# Patient Record
Sex: Male | Born: 1985 | Race: White | Hispanic: No | Marital: Single | Smoking: Current every day smoker
Health system: Southern US, Community
[De-identification: ages and names within clinical notes are randomized; demographics above are authoritative.]

## PROBLEM LIST (undated history)

## (undated) DIAGNOSIS — I10 Essential (primary) hypertension: Secondary | ICD-10-CM

---

## 2018-03-10 ENCOUNTER — Other Ambulatory Visit: Payer: Self-pay

## 2018-03-10 ENCOUNTER — Emergency Department (HOSPITAL_COMMUNITY)
Admission: EM | Admit: 2018-03-10 | Discharge: 2018-03-10 | Disposition: A | Discharge status: Home / Self Care | Payer: Self-pay | Attending: Emergency Medicine | Admitting: Emergency Medicine

## 2018-03-10 ENCOUNTER — Emergency Department (HOSPITAL_COMMUNITY): Payer: Self-pay

## 2018-03-10 ENCOUNTER — Encounter (HOSPITAL_COMMUNITY): Payer: Self-pay

## 2018-03-10 DIAGNOSIS — I10 Essential (primary) hypertension: Secondary | ICD-10-CM | POA: Insufficient documentation

## 2018-03-10 DIAGNOSIS — Y9389 Activity, other specified: Secondary | ICD-10-CM | POA: Insufficient documentation

## 2018-03-10 DIAGNOSIS — F17210 Nicotine dependence, cigarettes, uncomplicated: Secondary | ICD-10-CM | POA: Insufficient documentation

## 2018-03-10 DIAGNOSIS — Y999 Unspecified external cause status: Secondary | ICD-10-CM | POA: Insufficient documentation

## 2018-03-10 DIAGNOSIS — S71132A Puncture wound without foreign body, left thigh, initial encounter: Secondary | ICD-10-CM | POA: Insufficient documentation

## 2018-03-10 DIAGNOSIS — Y92818 Other transport vehicle as the place of occurrence of the external cause: Secondary | ICD-10-CM | POA: Insufficient documentation

## 2018-03-10 DIAGNOSIS — W3400XA Accidental discharge from unspecified firearms or gun, initial encounter: Secondary | ICD-10-CM | POA: Insufficient documentation

## 2018-03-10 DIAGNOSIS — S3991XA Unspecified injury of abdomen, initial encounter: Secondary | ICD-10-CM | POA: Insufficient documentation

## 2018-03-10 HISTORY — DX: Essential (primary) hypertension: I10

## 2018-03-10 LAB — CBC
HCT: 44.3 % (ref 39.0–52.0)
Hemoglobin: 14.4 g/dL (ref 13.0–17.0)
MCH: 28.2 pg (ref 26.0–34.0)
MCHC: 32.5 g/dL (ref 30.0–36.0)
MCV: 86.9 fL (ref 80.0–100.0)
Platelets: 246 10*3/uL (ref 150–400)
RBC: 5.1 MIL/uL (ref 4.22–5.81)
RDW: 12.3 % (ref 11.5–15.5)
WBC: 7.9 10*3/uL (ref 4.0–10.5)
nRBC: 0 % (ref 0.0–0.2)

## 2018-03-10 LAB — SAMPLE TO BLOOD BANK

## 2018-03-10 LAB — URINALYSIS, ROUTINE W REFLEX MICROSCOPIC
Bilirubin Urine: NEGATIVE
Glucose, UA: NEGATIVE mg/dL
Hgb urine dipstick: NEGATIVE
Ketones, ur: NEGATIVE mg/dL
Leukocytes, UA: NEGATIVE
Nitrite: NEGATIVE
PH: 6.5 (ref 5.0–8.0)
Protein, ur: NEGATIVE mg/dL
Specific Gravity, Urine: 1.015 (ref 1.005–1.030)

## 2018-03-10 LAB — COMPREHENSIVE METABOLIC PANEL
ALT: 62 U/L — AB (ref 0–44)
AST: 43 U/L — ABNORMAL HIGH (ref 15–41)
Albumin: 4.4 g/dL (ref 3.5–5.0)
Alkaline Phosphatase: 57 U/L (ref 38–126)
Anion gap: 13 (ref 5–15)
BUN: 15 mg/dL (ref 6–20)
CO2: 24 mmol/L (ref 22–32)
CREATININE: 0.99 mg/dL (ref 0.61–1.24)
Calcium: 9.3 mg/dL (ref 8.9–10.3)
Chloride: 102 mmol/L (ref 98–111)
GFR calc non Af Amer: >60 mL/min (ref >60)
Glucose, Bld: 88 mg/dL (ref 70–99)
Potassium: 4.4 mmol/L (ref 3.5–5.1)
Sodium: 139 mmol/L (ref 135–145)
Total Bilirubin: 1.2 mg/dL (ref 0.3–1.2)
Total Protein: 7.7 g/dL (ref 6.5–8.1)

## 2018-03-10 LAB — I-STAT TROPONIN, ED: Troponin i, poc: 0 ng/mL (ref 0.00–0.08)

## 2018-03-10 LAB — I-STAT CG4 LACTIC ACID, ED: Lactic Acid, Venous: 2.17 mmol/L (ref 0.5–1.9)

## 2018-03-10 LAB — PROTIME-INR
INR: 0.95
Prothrombin Time: 12.6 seconds (ref 11.4–15.2)

## 2018-03-10 LAB — CDS SEROLOGY

## 2018-03-10 LAB — ETHANOL: Alcohol, Ethyl (B): 37 mg/dL — ABNORMAL HIGH (ref <10)

## 2018-03-10 MED ORDER — SODIUM CHLORIDE 0.9 % IV SOLN
Freq: Once | INTRAVENOUS | Status: AC
  Administered 2018-03-10: 09:00:00 via INTRAVENOUS

## 2018-03-10 MED ORDER — CEPHALEXIN 500 MG PO CAPS: 1000.0000 mg | ORAL_CAPSULE | Freq: Two times a day (BID) | ORAL | 0 refills | Status: DC

## 2018-03-10 MED ORDER — IBUPROFEN 800 MG PO TABS: 800.0000 mg | ORAL_TABLET | Freq: Three times a day (TID) | ORAL | 0 refills | Status: AC

## 2018-03-10 MED ORDER — CEFAZOLIN SODIUM-DEXTROSE 1-4 GM/50ML-% IV SOLN
1.0000 g | Freq: Once | INTRAVENOUS | Status: AC
  Administered 2018-03-10: 1 g via INTRAVENOUS
  Filled 2018-03-10: qty 50

## 2018-03-10 MED ORDER — MORPHINE SULFATE (PF) 4 MG/ML IV SOLN
4.0000 mg | Freq: Once | INTRAVENOUS | Status: AC
  Administered 2018-03-10: 4 mg via INTRAVENOUS
  Filled 2018-03-10: qty 1

## 2018-03-10 MED ORDER — IOPAMIDOL (ISOVUE-370) INJECTION 76%
100.0000 mL | Freq: Once | INTRAVENOUS | Status: AC | PRN
  Administered 2018-03-10: 100 mL via INTRAVENOUS
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
## 2018-03-10 NOTE — ED Notes (Signed)
Pt taken to CT scan.

## 2018-03-10 NOTE — ED Notes (Signed)
Pt states that he did 2 lines of cocaine early this morning, took a xanax, and drank alcohol. Pt is on suboxone for previous opana use. Also states that he has been having chest pain x 2 days.
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
## 2020-09-10 ENCOUNTER — Encounter (HOSPITAL_COMMUNITY): Payer: Self-pay

## 2020-09-10 ENCOUNTER — Emergency Department (HOSPITAL_COMMUNITY)
Admission: EM | Admit: 2020-09-10 | Discharge: 2020-09-11 | Disposition: A | Discharge status: Home / Self Care | Payer: Managed Care, Other (non HMO) | Attending: Emergency Medicine | Admitting: Emergency Medicine

## 2020-09-10 ENCOUNTER — Other Ambulatory Visit: Payer: Self-pay

## 2020-09-10 DIAGNOSIS — X58XXXA Exposure to other specified factors, initial encounter: Secondary | ICD-10-CM | POA: Diagnosis not present

## 2020-09-10 DIAGNOSIS — S60521A Blister (nonthermal) of right hand, initial encounter: Secondary | ICD-10-CM | POA: Diagnosis present

## 2020-09-10 DIAGNOSIS — Z5321 Procedure and treatment not carried out due to patient leaving prior to being seen by health care provider: Secondary | ICD-10-CM | POA: Diagnosis not present

## 2020-09-10 DIAGNOSIS — K130 Diseases of lips: Secondary | ICD-10-CM | POA: Diagnosis not present

## 2020-09-10 DIAGNOSIS — S60522A Blister (nonthermal) of left hand, initial encounter: Secondary | ICD-10-CM | POA: Diagnosis not present
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
## 2020-09-10 NOTE — ED Triage Notes (Addendum)
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 Pt c/o blisters on hands, c/o lips bleeding. There are no open wounds to hands. Pts lips are not currently bleeding, there are no open wounds to lips. Pt states he has not taken his Seroquel in a day and a half. Pt states he "just doesn't feel good at all." Pt denies SI and HI.

## 2020-11-30 DIAGNOSIS — R778 Other specified abnormalities of plasma proteins: Secondary | ICD-10-CM

## 2021-12-15 ENCOUNTER — Emergency Department (HOSPITAL_COMMUNITY): Payer: Commercial Managed Care - PPO

## 2021-12-15 ENCOUNTER — Emergency Department (HOSPITAL_COMMUNITY)
Admission: EM | Admit: 2021-12-15 | Discharge: 2021-12-15 | Disposition: A | Discharge status: Home / Self Care | Payer: Commercial Managed Care - PPO | Attending: Emergency Medicine | Admitting: Emergency Medicine

## 2021-12-15 ENCOUNTER — Encounter (HOSPITAL_COMMUNITY): Payer: Self-pay | Admitting: Emergency Medicine

## 2021-12-15 DIAGNOSIS — K047 Periapical abscess without sinus: Secondary | ICD-10-CM | POA: Insufficient documentation

## 2021-12-15 DIAGNOSIS — R220 Localized swelling, mass and lump, head: Secondary | ICD-10-CM | POA: Diagnosis present

## 2021-12-15 DIAGNOSIS — L03211 Cellulitis of face: Secondary | ICD-10-CM

## 2021-12-15 LAB — CBC WITH DIFFERENTIAL/PLATELET
Abs Immature Granulocytes: 0.04 10*3/uL (ref 0.00–0.07)
Basophils Absolute: 0 10*3/uL (ref 0.0–0.1)
Basophils Relative: 0 %
Eosinophils Absolute: 0 10*3/uL (ref 0.0–0.5)
Eosinophils Relative: 1 %
HCT: 41.6 % (ref 39.0–52.0)
Hemoglobin: 13.2 g/dL (ref 13.0–17.0)
Immature Granulocytes: 1 %
Lymphocytes Relative: 16 %
Lymphs Abs: 1.4 10*3/uL (ref 0.7–4.0)
MCH: 27 pg (ref 26.0–34.0)
MCHC: 31.7 g/dL (ref 30.0–36.0)
MCV: 85.2 fL (ref 80.0–100.0)
Monocytes Absolute: 1 10*3/uL (ref 0.1–1.0)
Monocytes Relative: 12 %
Neutro Abs: 6.1 10*3/uL (ref 1.7–7.7)
Neutrophils Relative %: 70 %
Platelets: 305 10*3/uL (ref 150–400)
RBC: 4.88 MIL/uL (ref 4.22–5.81)
RDW: 12.7 % (ref 11.5–15.5)
WBC: 8.7 10*3/uL (ref 4.0–10.5)
nRBC: 0 % (ref 0.0–0.2)

## 2021-12-15 LAB — BASIC METABOLIC PANEL
Anion gap: 11 (ref 5–15)
BUN: 14 mg/dL (ref 6–20)
CO2: 25 mmol/L (ref 22–32)
Calcium: 9.6 mg/dL (ref 8.9–10.3)
Chloride: 103 mmol/L (ref 98–111)
Creatinine, Ser: 0.98 mg/dL (ref 0.61–1.24)
GFR, Estimated: >60 mL/min (ref >60)
Glucose, Bld: 100 mg/dL — ABNORMAL HIGH (ref 70–99)
Potassium: 4.2 mmol/L (ref 3.5–5.1)
Sodium: 139 mmol/L (ref 135–145)

## 2021-12-15 LAB — I-STAT CHEM 8, ED
BUN: 15 mg/dL (ref 6–20)
Calcium, Ion: 1.17 mmol/L (ref 1.15–1.40)
Chloride: 105 mmol/L (ref 98–111)
Creatinine, Ser: 0.9 mg/dL (ref 0.61–1.24)
Glucose, Bld: 97 mg/dL (ref 70–99)
HCT: 42 % (ref 39.0–52.0)
Hemoglobin: 14.3 g/dL (ref 13.0–17.0)
Potassium: 4.1 mmol/L (ref 3.5–5.1)
Sodium: 139 mmol/L (ref 135–145)
TCO2: 24 mmol/L (ref 22–32)

## 2021-12-15 MED ORDER — AMOXICILLIN-POT CLAVULANATE 875-125 MG PO TABS
1.0000 | ORAL_TABLET | Freq: Once | ORAL | Status: AC
  Administered 2021-12-15: 1 via ORAL
  Filled 2021-12-15: qty 1

## 2021-12-15 MED ORDER — AMOXICILLIN-POT CLAVULANATE 875-125 MG PO TABS: 1.0000 | ORAL_TABLET | Freq: Two times a day (BID) | ORAL | 0 refills | Status: AC

## 2021-12-15 MED ORDER — IOHEXOL 350 MG/ML SOLN
100.0000 mL | Freq: Once | INTRAVENOUS | Status: AC | PRN
  Administered 2021-12-15: 75 mL via INTRAVENOUS

## 2021-12-15 NOTE — ED Provider Notes (Signed)
[facility] EMERGENCY DEPARTMENT Provider Note   CSN: [id number] Arrival date & time: 12/15/21  1202     History  Chief Complaint  Patient presents with   Facial Swelling    Ronald Patel is a 36 y.o. male with history of hypertension who presents the emergency department complaining of facial swelling.  Patient states that he has always had a "bad tooth" in his left lower mouth for "a while".  States that the left side of his neck and his face started swelling about 3 days ago.  No fever.  Having some difficulty opening his mouth, but no difficulty swallowing or breathing.  Has not seen a dentist in some time.  HPI     Home Medications Prior to Admission medications   Medication Sig Start Date End Date Taking? Authorizing Provider  amoxicillin-clavulanate (AUGMENTIN) 875-125 MG tablet Take 1 tablet by mouth 2 (two) times daily for 14 days. 12/15/21 12/29/21 Yes Raymond Azure T, PA-C  ibuprofen (ADVIL,MOTRIN) 800 MG tablet Take 1 tablet (800 mg total) by mouth 3 (three) times daily. 03/10/18   Charlesetta Shanks, MD      Allergies    Patient has no known allergies.    Review of Systems   Review of Systems  Constitutional:  Negative for chills and fever.  HENT:  Positive for dental problem and facial swelling. Negative for sore throat, trouble swallowing and voice change.   Respiratory:  Negative for shortness of breath.   All other systems reviewed and are negative.   Physical Exam Updated Vital Signs BP (!) 165/90 (BP Location: Right Arm)   Pulse 100   Temp 98.5 F (36.9 C) (Oral)   Resp 16   SpO2 99%  Physical Exam Vitals and nursing note reviewed.  Constitutional:      Appearance: Normal appearance.  HENT:     Head: Normocephalic and atraumatic.     Mouth/Throat:     Comments: Mild trismus.  Left submandibular swelling and tenderness, without crepitus.  No sublingual swelling.  Tolerating own secretions. Eyes:     Conjunctiva/sclera:  Conjunctivae normal.  Neck:     Trachea: Phonation normal.     Comments: Left-sided neck swelling, without lymphadenopathy Pulmonary:     Effort: Pulmonary effort is normal. No respiratory distress.     Breath sounds: No stridor.  Skin:    General: Skin is warm and dry.  Neurological:     Mental Status: He is alert.  Psychiatric:        Mood and Affect: Mood normal.        Behavior: Behavior normal.     ED Results / Procedures / Treatments   Labs (all labs ordered are listed, but only abnormal results are displayed) Labs Reviewed  BASIC METABOLIC PANEL - Abnormal; Notable for the following components:      Result Value   Glucose, Bld 100 (*)    All other components within normal limits  CBC WITH DIFFERENTIAL/PLATELET  I-STAT CHEM 8, ED    EKG None  Radiology CT Soft Tissue Neck W Contrast  Result Date: 12/15/2021 CLINICAL DATA:  Soft tissue swelling left face. Infection suspected. EXAM: CT NECK WITH CONTRAST TECHNIQUE: Multidetector CT imaging of the neck was performed using the standard protocol following the bolus administration of intravenous contrast. RADIATION DOSE REDUCTION: This exam was performed according to the departmental dose-optimization program which includes automated exposure control, adjustment of the mA and/or kV according to patient size and/or use of iterative reconstruction  technique. CONTRAST:  33mL OMNIPAQUE IOHEXOL 350 MG/ML SOLN COMPARISON:  None Available. FINDINGS: Pharynx and larynx: Normal. No mass or swelling. Salivary glands: No inflammation, mass, or stone. Thyroid: Negative Lymph nodes: No pathologic adenopathy in the neck. Right level 2 lymph node 8.3 mm. Left level 2 lymph node 10 mm. Vascular: Normal vascular enhancement. Limited intracranial: Negative Visualized orbits: Negative Mastoids and visualized paranasal sinuses: Mild mucosal edema left maxillary sinus. Remaining sinuses clear. Skeleton: Negative for fracture Poor dentition with  multiple caries. Periapical lucency around left upper and lower molars. Upper chest: Lung apices clear bilaterally Other: Soft tissue swelling lateral to the maxilla and mandible on the left. This is likely cellulitis due to adjacent dental disease. No soft tissue abscess Metal BB in the soft tissues anterior to the mandible on the right. IMPRESSION: Soft tissue swelling lateral to the maxilla mandible on the left. This is most likely cellulitis related to dental infection. No soft tissue abscess. Electronically Signed   By: Franchot Gallo M.D.   On: 12/15/2021 15:20    Procedures Procedures    Medications Ordered in ED Medications  amoxicillin-clavulanate (AUGMENTIN) 875-125 MG per tablet 1 tablet (has no administration in time range)  iohexol (OMNIPAQUE) 350 MG/ML injection 100 mL (75 mLs Intravenous Contrast Given 12/15/21 1506)    ED Course/ Medical Decision Making/ A&P                           Medical Decision Making Risk Prescription drug management.  This patient is a 36 y.o. male  who presents to the ED for concern of left sided facial swelling and dental pain.   Differential diagnoses prior to evaluation: The emergent differential diagnosis includes, but is not limited to,  dental abscess, cellulitis, PTA, ludwig's angina. This is not an exhaustive differential.   Past Medical History / Co-morbidities: HTN  Physical Exam: Physical exam performed. The pertinent findings include: Afebrile, not tachycardic. Mild trismus. Left sided facial and neck swelling, with tenderness over submandibular area. No lymphadenopathy. No sublingual swelling. Normal phonation. Tolerating PO and no stridor.   Lab Tests/Imaging studies: I personally interpreted labs/imaging and the pertinent results include: CBC and BMP unremarkable.  CT soft tissue neck with soft tissue swelling lateral to the mandible on the left, no soft tissue abscess.  Likely cellulitis due to adjacent dental disease.  I agree  with the radiologist interpretation.  Medications: I ordered medication including antibiotics.  I have reviewed the patients home medicines and have made adjustments as needed.   Disposition: After consideration of the diagnostic results and the patients response to treatment, I feel that emergency department workup does not suggest an emergent condition requiring admission or immediate intervention beyond what has been performed at this time. The plan is: discharge to home with antibiotics and dental follow up. Low suspicion for Ludwig's angina at this time, due to normal temperature, normal laboratory workup, no sublingual swelling, and no difficulty swallowing or breathing. Patient is at high risk for developing worsening infection and we had thorough discussion about return precautions. The patient is safe for discharge and has been instructed to return immediately for worsening symptoms, change in symptoms or any other concerns. Provided with dental resources.  I discussed this case with my attending physician Dr. Armandina Gemma who cosigned this note including patient's presenting symptoms, physical exam, and planned diagnostics and interventions. Attending physician stated agreement with plan or made changes to plan which were implemented.  Final Clinical Impression(s) / ED Diagnoses Final diagnoses:  Dental infection  Facial cellulitis    Rx / DC Orders ED Discharge Orders          Ordered    amoxicillin-clavulanate (AUGMENTIN) 875-125 MG tablet  2 times daily        12/15/21 1815           Portions of this report may have been transcribed using voice recognition software. Every effort was made to ensure accuracy; however, inadvertent computerized transcription errors may be present.    Estill Cotta 12/15/21 1849    Regan Lemming, MD 12/15/21 2114

## 2021-12-15 NOTE — ED Triage Notes (Signed)
Patient is here with complaint of painful swelling around the left cheek and jaw that started a two days ago. Patient is alert, oriented, and in no apparent distress at this time.

## 2021-12-15 NOTE — ED Notes (Signed)
Provided pt with bag lunch and beverage.

## 2021-12-15 NOTE — ED Notes (Signed)
Sort nurse note- patient c/o left sided facial swelling and pain for past 2 days. States has had this happen in the past before but always goes down on its own.

## 2021-12-15 NOTE — ED Provider Triage Note (Signed)
Emergency Medicine Provider Triage Evaluation Note  Ronald Patel , a 36 y.o. male  was evaluated in triage.  Pt complains of left-sided facial swelling and throat swelling x3 days.  Patient had some dental pain, swelling that rapidly progressed.  Now having swelling down below the angle of the mandible on the left, pain with swallowing, no fever or chills.  Significant pain..  Review of Systems  Positive: Facial pain and swelling, mild trismus Negative: Fever  Physical Exam  BP (!) 166/94 (BP Location: Left Arm)   Pulse (!) 101   Temp 98.7 F (37.1 C) (Oral)   Resp 17   SpO2 97%  Gen:   Awake, no distress   Resp:  Normal effort  MSK:   Moves extremities without difficulty  Other:  Mild trismus, tense swelling over the left mandible, swelling below the angle of the mandible into the left side of the neck.  No stridor, tolerating p.o.'s  Medical Decision Making  Medically screening exam initiated at 12:59 PM.  Appropriate orders placed.  Ronald Patel was informed that the remainder of the evaluation will be completed by another provider, this initial triage assessment does not replace that evaluation, and the importance of remaining in the ED until their evaluation is complete.  Work-up initiated, concern for Ludwick's angina, case discussed with triage nurse made higher acuity.   Margarita Mail, PA-C 12/15/21 1341

## 2021-12-15 NOTE — Discharge Instructions (Addendum)
You were seen in the emergency department for dental pain and facial swelling.  As we discussed, I think your facial swelling is due to a dental infection. We would like to treat you with antibiotics. This medication is cheaper at the pharmacy using a coupon through [employer], which you can make an online account. I recommend taking ibuprofen as needed for pain.  Dental/facial infections can worsen and become something called Ludwig's angina. This is a severe infection which requires hospitalization. If you have any worsening swelling, changes in your voice, difficulty breathing or swallowing, return to the ER immediately.    I also recommend following up with a dentist when able. I've attached some resources for dentists in the area.
